# Patient Record
Sex: Female | Born: 1965 | Race: White | Hispanic: No | Marital: Married | Smoking: Never smoker
Health system: Southern US, Community
[De-identification: ages and names within clinical notes are randomized; demographics above are authoritative.]

## PROBLEM LIST (undated history)

## (undated) DIAGNOSIS — G43909 Migraine, unspecified, not intractable, without status migrainosus: Secondary | ICD-10-CM

## (undated) DIAGNOSIS — R42 Dizziness and giddiness: Secondary | ICD-10-CM

## (undated) DIAGNOSIS — E079 Disorder of thyroid, unspecified: Secondary | ICD-10-CM

---

## 1998-09-18 ENCOUNTER — Other Ambulatory Visit: Admission: RE | Admit: 1998-09-18 | Discharge: 1998-09-18 | Payer: Self-pay | Admitting: Gynecology

## 1999-06-22 ENCOUNTER — Other Ambulatory Visit: Admission: RE | Admit: 1999-06-22 | Discharge: 1999-06-22 | Payer: Self-pay | Admitting: Obstetrics and Gynecology

## 1999-12-11 ENCOUNTER — Encounter (HOSPITAL_COMMUNITY): Admission: RE | Admit: 1999-12-11 | Discharge: 2000-01-09 | Payer: Self-pay | Admitting: Obstetrics and Gynecology

## 1999-12-18 ENCOUNTER — Encounter: Payer: Self-pay | Admitting: Obstetrics and Gynecology

## 1999-12-18 ENCOUNTER — Inpatient Hospital Stay (HOSPITAL_COMMUNITY): Admission: AD | Admit: 1999-12-18 | Discharge: 1999-12-20 | Payer: Self-pay | Admitting: Obstetrics and Gynecology

## 2000-01-08 ENCOUNTER — Inpatient Hospital Stay (HOSPITAL_COMMUNITY): Admission: AD | Admit: 2000-01-08 | Discharge: 2000-01-11 | Payer: Self-pay | Admitting: Obstetrics & Gynecology

## 2000-01-08 ENCOUNTER — Encounter (INDEPENDENT_AMBULATORY_CARE_PROVIDER_SITE_OTHER): Payer: Self-pay

## 2000-03-26 ENCOUNTER — Other Ambulatory Visit: Admission: RE | Admit: 2000-03-26 | Discharge: 2000-03-26 | Payer: Self-pay | Admitting: Obstetrics and Gynecology

## 2000-11-05 ENCOUNTER — Other Ambulatory Visit: Admission: RE | Admit: 2000-11-05 | Discharge: 2000-11-05 | Payer: Self-pay | Admitting: Gynecology

## 2002-05-04 ENCOUNTER — Other Ambulatory Visit: Admission: RE | Admit: 2002-05-04 | Discharge: 2002-05-04 | Payer: Self-pay | Admitting: Gynecology

## 2004-02-07 ENCOUNTER — Other Ambulatory Visit: Admission: RE | Admit: 2004-02-07 | Discharge: 2004-02-07 | Payer: Self-pay | Admitting: Gynecology

## 2004-02-20 ENCOUNTER — Encounter: Admission: RE | Admit: 2004-02-20 | Discharge: 2004-02-20 | Payer: Self-pay | Admitting: Gynecology

## 2004-08-15 ENCOUNTER — Encounter (INDEPENDENT_AMBULATORY_CARE_PROVIDER_SITE_OTHER): Payer: Self-pay | Admitting: Specialist

## 2004-08-15 ENCOUNTER — Observation Stay (HOSPITAL_COMMUNITY): Admission: RE | Admit: 2004-08-15 | Discharge: 2004-08-15 | Payer: Self-pay | Admitting: Gynecology

## 2006-07-30 ENCOUNTER — Encounter: Admission: RE | Admit: 2006-07-30 | Discharge: 2006-07-30 | Payer: Self-pay | Admitting: Gynecology

## 2009-02-08 ENCOUNTER — Encounter: Admission: RE | Admit: 2009-02-08 | Discharge: 2009-02-08 | Payer: Self-pay | Admitting: Obstetrics and Gynecology

## 2011-02-01 NOTE — Op Note (Signed)
[facility]  Patient:    Suzanne Nash, Suzanne Nash                      MRN: [id number] Proc. Date: 01/08/00 Adm. Date:  [id number] Attending:  Minette Headland                           Operative Report  PREOPERATIVE DIAGNOSES:       Twin pregnancy at 36-2/[redacted] weeks gestation, vertex/breech presentation.  Patient had been prepared for cesarean delivery and patient and husband requested cesarean delivery because of the breech presentation of the second infant.  POSTOPERATIVE DIAGNOSES:      Twin pregnancy at 36-2/[redacted] weeks gestation, vertex/breech presentation.  Patient had been prepared for cesarean delivery and patient and husband requested cesarean delivery because of the breech presentation of the second infant.  OPERATIVE PROCEDURE:          Primary low transverse cervical cesarean section.  SECONDARY PROCEDURE:          Bilateral tubal ligation by Pomeroy technique.  SURGEON:                      Freddy Finner, M.D.  ESTIMATED INTRAOPERATIVE BLOOD LOSS:  600 to 800 cc.  INTRAOPERATIVE COMPLICATIONS:  None.  ANESTHESIA:                   Spinal.  INDICATIONS:                  Patient is a 45 year old white married female, gravida 3, para 2, who presented in very active labor at 8 to 9 cm of dilatation with twin pregnancy, vertex/breech.  She had had a careful consultation in the office and had decided on cesarean delivery, given the breech presentation of the second infant.  For that reason, we are now proceeding with cesarean.  She also has requested permanent surgical sterilization.  DESCRIPTION OF PROCEDURE:     Patient was taken to the operating room and placed under spinal anesthesia.  Please note that spontaneous rupture of membranes occurred approximately 10 minutes prior to arrival in the operating suite. Fluid was clear.  Patient was placed under spinal anesthesia and placed in dorsal recumbent position with elevation of the  right hip by 15 degrees.  Abdomen was prepped with the usual fashion.  Foley catheter was placed using sterile technique. Sterile drapes were applied.  A lower abdominal transverse incision was made and carried sharply down to fascia, which was entered sharply, and extended to the extent of the skin incision.  Rectus sheath was developed superiorly and inferiorly with blunt and sharp dissection, rectus muscle divided in the midline, peritoneum was entered sharply and extended bluntly and sharply to the extent of the skin incision.  Bladder blade was placed.  A transverse incision was made in the visceroperitoneum overlying the lower uterine segment and bladder bluntly dissected off the lower segment.  A transverse incision was made in the lower uterine segment and extended bluntly in a transverse direction.  A viable female infant was then delivered without difficulty.  Apgars were 8/9.  Arterial cord blood was sent for gases for two twins.  The second sac was then ruptured and a breech extraction as accomplished without significant difficulty of a viable female whose Apgars were /8. Routine and arterial cord blood gases were obtained for both infants. Placentae were then removed;  this was confirmed by manual exploration of the uterus. Uterine incision was grasped with ring forceps.  Uterine incision was then closed with running-locking 0 Monocryl.  Hemostasis was adequate.  Bladder flap was reapproximated with a running 0 Monocryl.  Irrigation was carried out.  Tubes and ovaries were inspected and found to be normal.  Uterus was normal.  The midportion of each tube was elevated with a Babcock and doubly ligated with 0 plain ties and a segment of tube excised and the mucosal segments of the tubes fulgurated on their cut ends.  Hemostasis again was adequate.  With appropriate pack, needle and instrument counts, the abdominal incision was closed in layers.  Running 0 Monocryl was  used to close the peritoneum and reapproximate the rectus muscles, fascia was closed with running 0 PDS and skin was closed with wide skin staples and quarter-inch Steri-Strips.  The patient tolerated the operative procedure well nd was taken to recovery room in good condition. DD:  01/08/00 TD:  01/08/00 Job: [id number]

## 2011-02-01 NOTE — Discharge Summary (Signed)
[facility]  Patient:    Suzanne Nash, Suzanne Nash                      MRN: [id number] Adm. Date:  [id number] Disc. Date: 12/20/99 Attending:  Madelyn Flavors CC:         Guy Sandifer. Arleta Creek, M.D.                           Discharge Summary  ADMISSION DIAGNOSES:          1. Intrauterine twin gestation at 33-3/7th weeks.                               2. Preterm labor.  DISCHARGE DIAGNOSES:          1. Intrauterine twin gestation at 33-3/7th weeks.                               2. Preterm labor.  REASON FOR ADMISSION:         This patient is a 45 year old married white female, G 3, P 2, with known twins at 33-3/7ths weeks, found to be contracting on the monitor.  The cervix was 2.5 cm dilated, 50% effaced.  She was admitted for magnesium sulfate tocolysis.  HOSPITAL COURSE:              She was admitted to the hospital and placed on magnesium sulfate.  This does a good job of stopping her uterine contractions.  LABORATORY DATA:              Laboratory returned consistent with a hemoglobin f 10.7, platelets 227,000, white count 10.8.  Creatinine 0.5, SGOT 16, SGPT 11, LDH 132, uric acid 3.6.  Urinalysis was negative, and Group-B beta Streptococcus is  pending at the time of discharge.  An  ultrasound was done to check for presentation.  Both babies were in the vertex presentation.  Baby A is on maternal left and baby B is on maternal right.  The patient was weaned from her magnesium on December 19, 1999.  She has been maintained on terbutaline 2.5 mg q.2h.  She is not contracting with this.  Her pulse is in the 80s and regular.  CONDITION ON DISCHARGE:       Good.  DIET:                         Regular diet as tolerated.  ACTIVITIES:                   Bedrest with bathroom privileges.  Pelvic rest. o lifting.  INSTRUCTIONS:                 She is to call the office for problems, including, but not limited to, contractions, vaginal bleeding,  leaking, shortness of breath. She is given instructions to check her pulse before taking terbutaline and to withhold it if her pulse is above 110.  She will then check one hour later, and  then take it if her pulse is below 110.  MEDICATIONS:                  1. Terbutaline 2.5 mg #100, one refill, one  p.o. q.2h.                               2. Amoxicillin 250 mg p.o. t.i.d. for seven days.  DISPOSITION:                  Home uterine activity monitoring will be arranged.  FOLLOWUP:                     In the office in the next five to six days. DD:  12/20/99 TD:  12/20/99 Job: [id number]

## 2011-02-01 NOTE — H&P (Signed)
NAME:  Suzanne Nash, Suzanne Nash               ACCOUNT NO.:  [id number]   MEDICAL RECORD NO.:  [id number]          PATIENT TYPE:  AMB   LOCATION:  SDC                           FACILITY:  [facility]   PHYSICIAN:  Gretta Cool, M.D. DATE OF BIRTH:  [birth year]   DATE OF ADMISSION:  DATE OF DISCHARGE:                                HISTORY & PHYSICAL   CHIEF COMPLAINT:  1.  Complex cystic left adnexal mass, suspicious of benign cystic teratoma.      Persistent ___________ x3 months.  2.  Pelvic pain.   HISTORY OF PRESENT ILLNESS:  A 45 year old gravida 3 para 4 with history of  twin delivery presents for definitive evaluation of a left adnexal complex  cystic mass.  Suzanne Nash initially presented in September 2005 with  symptoms of UTI and was found on pelvic exam to have a complex cystic left  adnexal mass suspicious of benign cystic teratoma.  She was subsequently  seen for followup, and the mass again confirmed.  It has bright echo  suspicious of BCT.  We discussed definitive therapy when she presented with  severe backache with onset of menses August 06, 2004.  She has had  persistence of predominantly right lower quadrant pain since that time.  She  is now admitted for definitive therapy by diagnostic laparoscopy, possible  laparotomy, possible salpingo-oophorectomy.  She understands the alternative  therapies all and the risks including that this is not a benign entity.  We  have discussed the least possible surgery with plan for ovarian cystectomy  and EndoCatch removal by laparoscopy.  She understands the possible need for  extended surgery.   PAST MEDICAL HISTORY:  Usual childhood diseases without sequelae.   MEDICAL ILLNESSES:  No previous hospitalizations, except for childbirth.   PRESENT MEDICATIONS:  None.   ALLERGIES:  None known.   FAMILY HISTORY:  Father has history of blood pressure elevation and stroke,  age 5.  Mother has hypercholesterolemia.  Sisters and brothers  living and  well.  No other known significant familial tendency.   REVIEW OF SYSTEMS:  HEENT:  Denies symptoms.  CARDIORESPIRATORY:  Denies  asthma, cough, rhonchi, or shortness of breath.  GI/GU:  Denies frequency,  urgency, dysuria, change in bowel habits, food intolerance.   PHYSICAL EXAMINATION:  GENERAL:  Well-developed, well-nourished thin white  female.  HEENT:  Pupils equal and reactive to light and accommodation.  Fundi not  examined.  Oropharynx clear.  NECK:  Supple, without masses or thyroid enlargement.  CHEST:  Clear to P&A.  HEART:  Regular rhythm, without murmur or cardiac enlargement.  BREASTS:  Without masses, nodes, or nipple discharge.  ABDOMEN:  Soft.  There is tenderness in her right lower quadrant, but no  rebound.  No CVA tenderness.  PELVIC:  External genitalia:  Normal female.  Vagina clean, rugose.  Adequate support.  Cervix is parous, clean.  Uterus:  Normal size, shape,  contour.  There is a left adnexal complex mas with fullness in the left  adnexal area and moderate tenderness.  Moderate tenderness in the right  adnexal area, but  no evidence of enlargement.  Rectovaginal exam confirms.  EXTREMITIES:  Negative.  NEUROLOGIC:  Physiologic.   IMPRESSION:  1.  Complex cystic left adnexal mass, suspicious of benign cystic teratoma.  2.  Right lower abdominal pain of undetermined etiology.   PLAN:  Diagnostic laparoscopy.  Left oophorocystectomy versus possible  oophorectomy, possible laparotomy.  Risk/benefit ratio, alternative  therapies all discussed in detail.       CWL/MEDQ  D:  08/14/2004  T:  08/14/2004  Job:  [id number]   cc:   __________, M.D.   Gretta Cool, M.D.  [address] [place]  [place] [address]  Fax: [phone]

## 2011-02-01 NOTE — Discharge Summary (Signed)
[facility]  Patient:    Suzanne Nash, Suzanne Nash                      MRN: [id number] Adm. Date:  [id number] Disc. Date: [id number] Attending:  Minette Headland Dictator:   Danie Chandler, R.N.                           Discharge Summary  ADMISSION DIAGNOSES:          1. Twin pregnancy at 36-2/[redacted] weeks gestation.                               2. Vertex/breech presentation.  DISCHARGE DIAGNOSES:          1. Twin pregnancy at 36-2/[redacted] weeks gestation.                               2. Vertex/breech presentation.  PROCEDURE:                    On January 08, 2000, primary low transverse cesarean section.  REASON FOR ADMISSION:         The patient is a 45 year old, married white female gravida 3, para 2, who presented in very active labor at 8 to 9 cm of dilatation with twin pregnancy with vertex/breech presentation.  The patient had decided on cesarean delivery given the breech presentation of the second infant.  HOSPITAL COURSE:              The patient is taken to the operating room and undergoes the above named procedure without complications.  This was productive of twin A, a viable female infant with Apgars of 8 at one minute and 9 at five minutes and twin B, a viable female infant with Apgars of 7 at one minute and 8 at five minutes.  Postoperatively, the patient did well.  On postoperative day #1, the patient had a good return of bowel function and was tolerating a regular diet. She was also ambulating well without difficulty.  Her hemoglobin on this day was 9.7, hematocrit 29.7, and white blood cell count 12.0.  On postoperative day #2, her  hemoglobin was 9.7.  She had good control of pain and she was discharged to home on postoperative day #3.  CONDITION ON DISCHARGE:       Good.  DIET:                         Regular as tolerated.  ACTIVITY:                     No heavy lifting, no driving, and no vaginal entry.  FOLLOW-UP:                     She is to follow up in the office in one to two weeks for incision check and she is to call for a temperature greater than 100 degrees, persistent nausea and vomiting, heavy vaginal bleeding, and/or redness or drainage from the incision site.  DISCHARGE MEDICATIONS:        1. Prenatal vitamins one p.o. q.d.  2. Pain medicines p.r.n. as directed by M.D. DD:  01/11/00 TD:  01/16/00 Job: [id number]

## 2011-02-01 NOTE — Op Note (Signed)
NAME:  Suzanne Nash, Suzanne Nash               ACCOUNT NO.:  [id number]   MEDICAL RECORD NO.:  [id number]          PATIENT TYPE:  OBV   LOCATION:  9316                          FACILITY:  [facility]   PHYSICIAN:  Gretta Cool, M.D. DATE OF BIRTH:  [birth year]   DATE OF PROCEDURE:  08/15/2004  DATE OF DISCHARGE:  08/15/2004                                 OPERATIVE REPORT   PREOPERATIVE DIAGNOSES:  1.  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  2.  Pelvic pain.   POSTOPERATIVE DIAGNOSES:  1.  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  2.  Pelvic pain.  No evidence of source of pain.   SURGEON:  Dr. Nicholas Lose.   PROCEDURE:  Diagnostic laparoscopy, attempted oophorocystectomy subsequent  left oophorectomy with endo-catch removal.   DESCRIPTION OF PROCEDURE:  Under excellent general anesthesia, orotracheal  anesthesia with the patient prepped and draped in Allen stirrups, with Hulka  tenaculum applied to the cervix and bladder drained, a subumbilical incision  was made and Veress needle introduced.  After adequate pneumoperitoneum with  carbon dioxide, the laparoscope and trocar was introduced and pelvic organs  visualized.  To accessory ports were placed.  The pelvic organs were  thoroughly examined and there was no evidence of endometriosis. The appendix  was normal.  The liver and gallbladder appeared normal.  There was no  evidence of bowel pathology or pelvic pathology.  In fact, the left ovary  that had been diagnosed as a benign cystic teratoma looked perfectly healthy  and normal.  An incision was made in the capsule of the ovary using the  harmonics scalpel needle electrode.  The incision was made fairly deep into  the cortex before evidence of the presence of the benign cystic teratoma was  confirmed.  The capsule was exceedingly tough and thick and could not be, or  the cyst could not be easily shelled out.  Once the cyst began to leak, the  decision was made to remove the entire left  ovary.  A small amount of oily  sebaceous material drained from the capsule of the ovary.  The harmonics  scalpel was then used, Gyrus harmonics scalpel was then used to cauterize  the vessels and transect the vessels.  The ovary was then placed in an endo-  catch and delivered through the 10 mm umbilical site.  The site was then  enlarged and the ovary delivered through the enlarged port site.  At this  point, the specimen was submitted for pathologic examination.  The enlarged  port site was then closed down to 10 mm size with a running suture of #0  Vicryl with careful attention at approximation of anatomic approximation of  the fascial edge.  The pelvis was then re-examined and no evidence of  bleeding was present.  The right ovary had firm nodules protruding from its  surface suspicious for adenofibromas.  The adenofibromas were biopsied and  biopsy submitted separately.  The remainder of the, the other adenofibromas  were treated by harmonics scalpel, desiccation destruction.  At this point,  re-examination of the pelvis revealed no  evidence of any other pathology.  The pelvis was irrigated with lactated Ringer's and the ports removed after  evacuation of the gas.  The fascia was approximated with deep suture of 0  Vicryl, subcutaneous closure of 5-0 Vicryl and the skin closure with 5-0  Vicryl and Steri-Strips.  At the end of the procedure, sponge and lap counts  were correct.  There were no complications.  The patient returned to the  recovery room in excellent condition.      CWL/MEDQ  D:  08/15/2004  T:  08/15/2004  Job:  [id number]

## 2013-06-17 ENCOUNTER — Other Ambulatory Visit (HOSPITAL_BASED_OUTPATIENT_CLINIC_OR_DEPARTMENT_OTHER): Payer: Self-pay | Admitting: Obstetrics and Gynecology

## 2013-06-17 DIAGNOSIS — Z1231 Encounter for screening mammogram for malignant neoplasm of breast: Secondary | ICD-10-CM

## 2013-06-18 ENCOUNTER — Ambulatory Visit (HOSPITAL_BASED_OUTPATIENT_CLINIC_OR_DEPARTMENT_OTHER)
Admission: RE | Admit: 2013-06-18 | Discharge: 2013-06-18 | Disposition: A | Discharge status: Home / Self Care | Payer: 59 | Source: Ambulatory Visit | Attending: Obstetrics and Gynecology | Admitting: Obstetrics and Gynecology

## 2013-06-18 DIAGNOSIS — Z1231 Encounter for screening mammogram for malignant neoplasm of breast: Secondary | ICD-10-CM

## 2013-07-01 ENCOUNTER — Other Ambulatory Visit: Payer: Self-pay | Admitting: Obstetrics and Gynecology

## 2013-07-01 DIAGNOSIS — R928 Other abnormal and inconclusive findings on diagnostic imaging of breast: Secondary | ICD-10-CM

## 2013-07-14 ENCOUNTER — Ambulatory Visit
Admission: RE | Admit: 2013-07-14 | Discharge: 2013-07-14 | Disposition: A | Discharge status: Home / Self Care | Payer: 59 | Source: Ambulatory Visit | Attending: Obstetrics and Gynecology | Admitting: Obstetrics and Gynecology

## 2013-07-14 DIAGNOSIS — R928 Other abnormal and inconclusive findings on diagnostic imaging of breast: Secondary | ICD-10-CM

## 2013-12-15 ENCOUNTER — Ambulatory Visit: Payer: 59 | Admitting: Endocrinology

## 2013-12-16 ENCOUNTER — Other Ambulatory Visit (HOSPITAL_COMMUNITY): Payer: Self-pay | Admitting: *Deleted

## 2013-12-16 ENCOUNTER — Other Ambulatory Visit (HOSPITAL_COMMUNITY): Payer: Self-pay | Admitting: Endocrinology

## 2013-12-16 DIAGNOSIS — E0590 Thyrotoxicosis, unspecified without thyrotoxic crisis or storm: Secondary | ICD-10-CM

## 2013-12-20 ENCOUNTER — Encounter (HOSPITAL_BASED_OUTPATIENT_CLINIC_OR_DEPARTMENT_OTHER): Payer: Self-pay | Admitting: Emergency Medicine

## 2013-12-20 ENCOUNTER — Emergency Department (HOSPITAL_BASED_OUTPATIENT_CLINIC_OR_DEPARTMENT_OTHER)
Admission: EM | Admit: 2013-12-20 | Discharge: 2013-12-20 | Disposition: A | Discharge status: Home / Self Care | Payer: 59 | Attending: Emergency Medicine | Admitting: Emergency Medicine

## 2013-12-20 DIAGNOSIS — Y9389 Activity, other specified: Secondary | ICD-10-CM | POA: Insufficient documentation

## 2013-12-20 DIAGNOSIS — Z8639 Personal history of other endocrine, nutritional and metabolic disease: Secondary | ICD-10-CM | POA: Insufficient documentation

## 2013-12-20 DIAGNOSIS — Z23 Encounter for immunization: Secondary | ICD-10-CM | POA: Insufficient documentation

## 2013-12-20 DIAGNOSIS — Z862 Personal history of diseases of the blood and blood-forming organs and certain disorders involving the immune mechanism: Secondary | ICD-10-CM | POA: Insufficient documentation

## 2013-12-20 DIAGNOSIS — S61112A Laceration without foreign body of left thumb with damage to nail, initial encounter: Secondary | ICD-10-CM

## 2013-12-20 DIAGNOSIS — W269XXA Contact with unspecified sharp object(s), initial encounter: Secondary | ICD-10-CM | POA: Insufficient documentation

## 2013-12-20 DIAGNOSIS — S61209A Unspecified open wound of unspecified finger without damage to nail, initial encounter: Secondary | ICD-10-CM | POA: Insufficient documentation

## 2013-12-20 DIAGNOSIS — Y929 Unspecified place or not applicable: Secondary | ICD-10-CM | POA: Insufficient documentation

## 2013-12-20 HISTORY — DX: Disorder of thyroid, unspecified: E07.9

## 2013-12-20 MED ORDER — CEPHALEXIN 500 MG PO CAPS: 500.0000 mg | ORAL_CAPSULE | Freq: Four times a day (QID) | ORAL | Status: AC

## 2013-12-20 MED ORDER — TETANUS-DIPHTH-ACELL PERTUSSIS 5-2.5-18.5 LF-MCG/0.5 IM SUSP
0.5000 mL | Freq: Once | INTRAMUSCULAR | Status: AC
  Administered 2013-12-20: 0.5 mL via INTRAMUSCULAR
  Filled 2013-12-20: qty 0.5

## 2013-12-20 MED ORDER — HYDROCODONE-ACETAMINOPHEN 5-325 MG PO TABS: 1.0000 | ORAL_TABLET | Freq: Once | ORAL | Status: DC

## 2013-12-20 MED ORDER — ACETAMINOPHEN 325 MG PO TABS
650.0000 mg | ORAL_TABLET | Freq: Once | ORAL | Status: AC
  Administered 2013-12-20: 650 mg via ORAL
  Filled 2013-12-20: qty 2

## 2013-12-20 MED ORDER — HYDROCODONE-ACETAMINOPHEN 5-325 MG PO TABS: 1.0000 | ORAL_TABLET | Freq: Four times a day (QID) | ORAL | Status: AC | PRN

## 2013-12-20 MED ORDER — LIDOCAINE HCL 2 % IJ SOLN
10.0000 mL | Freq: Once | INTRAMUSCULAR | Status: AC
  Administered 2013-12-20: 200 mg via INTRADERMAL
  Filled 2013-12-20: qty 20

## 2013-12-20 NOTE — ED Provider Notes (Signed)
CSN: [id number]     Arrival date & time 12/20/13  1947 History   First MD Initiated Contact with Patient 12/20/13 2043     Chief Complaint  Patient presents with  . Extremity Laceration     (Consider location/radiation/quality/duration/timing/severity/associated sxs/prior Treatment) HPI  48 year old female presents for evaluation of thumb laceration. Patient reports she was cutting her bread earlier today when she accidentally cut her left thumb. Incident happened 2 hrs ago.  Pt is R hand dominant.  Unsure last tetanus shot, c/o sharp throbbing pain to affected area.  No other injury.    Past Medical History  Diagnosis Date  . Thyroid disease    History reviewed. No pertinent past surgical history. History reviewed. No pertinent family history. History  Substance Use Topics  . Smoking status: Never Smoker   . Smokeless tobacco: Not on file  . Alcohol Use: Yes     Comment: occasionally   OB History   Grav Para Term Preterm Abortions TAB SAB Ect Mult Living                 Review of Systems  Constitutional: Negative for fever.  Skin: Positive for wound.  Neurological: Negative for numbness.      Allergies  Review of patient's allergies indicates no known allergies.  Home Medications  No current outpatient prescriptions on file. BP 140/87  Pulse 60  Temp(Src) 98.5 F (36.9 C) (Oral)  Resp 18  SpO2 100%  LMP 09/21/2012 Physical Exam  Nursing note and vitals reviewed. Constitutional: She appears well-developed and well-nourished. No distress.  HENT:  Head: Atraumatic.  Eyes: Conjunctivae are normal.  Neck: Neck supple.  Musculoskeletal: She exhibits tenderness (L hand: a 2cm deep diagnonal lac at tip of finger through nail without bone involvement.  ttp.  sensation intact.  ).  Neurological: She is alert.  Skin: No rash noted.  Psychiatric: She has a normal mood and affect.    ED Course  Procedures (including critical care time)  8:50 PM L thumb lac.   tdap given, will digital block and suture.    LACERATION REPAIR Performed by: Fayrene HelperRAN,Atreus Hasz Authorized byFayrene Helper: Carlyann Placide Consent: Verbal consent obtained. Risks and benefits: risks, benefits and alternatives were discussed Consent given by: patient Patient identity confirmed: provided demographic data Prepped and Draped in normal sterile fashion Wound explored  Laceration Location: L thumb; tip  Laceration Length: 2cm  No Foreign Bodies seen or palpated  Anesthesia: digital nerve block  Local anesthetic: lidocaine 2% w/o epinephrine  Anesthetic total: 7 ml  Irrigation method: syringe Amount of cleaning: standard  Skin closure: prolene 5.0  Number of sutures: 5  Technique: simple interrupted  Patient tolerance: Patient tolerated the procedure well with no immediate complications.   Labs Review Labs Reviewed - No data to display Imaging Review No results found.   EKG Interpretation None      MDM   Final diagnoses:  Laceration of left thumb without foreign body with damage to nail    BP 140/87  Pulse 60  Temp(Src) 98.5 F (36.9 C) (Oral)  Resp 18  SpO2 100%  LMP 09/21/2012     Fayrene HelperBowie Alona Danford, PA-C 12/20/13 2135

## 2013-12-20 NOTE — ED Provider Notes (Signed)
Medical screening examination/treatment/procedure(s) were performed by non-physician practitioner and as supervising physician I was immediately available for consultation/collaboration.     Geoffery Lyonsouglas Kyon Bentler, MD 12/20/13 2250

## 2013-12-20 NOTE — Discharge Instructions (Signed)
Please follow up with your doctor in 1 week for sutures removal.  Take antibiotic only if you notice signs of infection  Fingertip Injuries and Amputations Fingertip injuries are common and often get injured because they are last to escape when pulling your hand out of harm's way. You have amputated (cut off) part of your finger. How this turns out depends largely on how much was amputated. If just the tip is amputated, often the end of the finger will grow back and the finger may return to much the same as it was before the injury.  If more of the finger is missing, your caregiver has done the best with the tissue remaining to allow you to keep as much finger as is possible. Your caregiver after checking your injury has tried to leave you with a painless fingertip that has durable, feeling skin. If possible, your caregiver has tried to maintain the finger's length and appearance and preserve its fingernail.  Please read the instructions outlined below and refer to this sheet in the next few weeks. These instructions provide you with general information on caring for yourself. Your caregiver may also give you specific instructions. While your treatment has been done according to the most current medical practices available, unavoidable complications occasionally occur. If you have any problems or questions after discharge, please call your caregiver. HOME CARE INSTRUCTIONS   You may resume normal diet and activities as directed or allowed.  Keep your hand elevated above the level of your heart. This helps decrease pain and swelling.  Keep ice packs (or a bag of ice wrapped in a towel) on the injured area for 15-20 minutes, 03-04 times per day, for the first two days.  Change dressings if necessary or as directed.  Clean the wound daily or as directed.  Only take over-the-counter or prescription medicines for pain, discomfort, or fever as directed by your caregiver.  Keep appointments as  directed. SEEK IMMEDIATE MEDICAL CARE IF:  You develop redness, swelling, numbness or increasing pain in the wound.  There is pus coming from the wound.  You develop an unexplained oral temperature above 102 F (38.9 C) or as your caregiver suggests.  There is a foul (bad) smell coming from the wound or dressing.  There is a breaking open of the wound (edges not staying together) after sutures or staples have been removed. MAKE SURE YOU:   Understand these instructions.  Will watch your condition.  Will get help right away if you are not doing well or get worse. Document Released: 07/24/2005 Document Revised: 11/25/2011 Document Reviewed: 06/22/2008 [facility] Patient Information 2014 [employer], [place].

## 2013-12-20 NOTE — ED Notes (Signed)
Pt reports that she was cutting bread and cut her left thumb. Bleeding is controlled.

## 2013-12-21 ENCOUNTER — Encounter (HOSPITAL_COMMUNITY)
Admission: RE | Admit: 2013-12-21 | Discharge: 2013-12-21 | Disposition: A | Discharge status: Home / Self Care | Payer: 59 | Source: Ambulatory Visit | Attending: Endocrinology | Admitting: Endocrinology

## 2013-12-21 DIAGNOSIS — E0590 Thyrotoxicosis, unspecified without thyrotoxic crisis or storm: Secondary | ICD-10-CM | POA: Insufficient documentation

## 2013-12-22 ENCOUNTER — Encounter (HOSPITAL_COMMUNITY)
Admission: RE | Admit: 2013-12-22 | Discharge: 2013-12-22 | Disposition: A | Discharge status: Home / Self Care | Payer: 59 | Source: Ambulatory Visit | Attending: Endocrinology | Admitting: Endocrinology

## 2013-12-22 MED ORDER — SODIUM IODIDE I 131 CAPSULE
17.0000 | Freq: Once | INTRAVENOUS | Status: AC | PRN
  Administered 2013-12-22: 17 via ORAL

## 2014-06-27 ENCOUNTER — Other Ambulatory Visit (HOSPITAL_BASED_OUTPATIENT_CLINIC_OR_DEPARTMENT_OTHER): Payer: Self-pay | Admitting: Obstetrics & Gynecology

## 2014-06-27 DIAGNOSIS — Z1239 Encounter for other screening for malignant neoplasm of breast: Secondary | ICD-10-CM

## 2014-06-28 ENCOUNTER — Ambulatory Visit (HOSPITAL_BASED_OUTPATIENT_CLINIC_OR_DEPARTMENT_OTHER)
Admission: RE | Admit: 2014-06-28 | Discharge: 2014-06-28 | Disposition: A | Discharge status: Home / Self Care | Payer: 59 | Source: Ambulatory Visit | Attending: Obstetrics & Gynecology | Admitting: Obstetrics & Gynecology

## 2014-06-28 DIAGNOSIS — Z1231 Encounter for screening mammogram for malignant neoplasm of breast: Secondary | ICD-10-CM | POA: Diagnosis present

## 2014-06-28 DIAGNOSIS — Z1239 Encounter for other screening for malignant neoplasm of breast: Secondary | ICD-10-CM

## 2015-06-15 ENCOUNTER — Other Ambulatory Visit (HOSPITAL_BASED_OUTPATIENT_CLINIC_OR_DEPARTMENT_OTHER): Payer: Self-pay | Admitting: Obstetrics & Gynecology

## 2015-06-15 DIAGNOSIS — Z1231 Encounter for screening mammogram for malignant neoplasm of breast: Secondary | ICD-10-CM

## 2015-06-20 ENCOUNTER — Ambulatory Visit (HOSPITAL_BASED_OUTPATIENT_CLINIC_OR_DEPARTMENT_OTHER)
Admission: RE | Admit: 2015-06-20 | Discharge: 2015-06-20 | Disposition: A | Discharge status: Home / Self Care | Payer: 59 | Source: Ambulatory Visit | Attending: Obstetrics & Gynecology | Admitting: Obstetrics & Gynecology

## 2015-06-20 DIAGNOSIS — Z1231 Encounter for screening mammogram for malignant neoplasm of breast: Secondary | ICD-10-CM | POA: Insufficient documentation

## 2015-07-22 ENCOUNTER — Other Ambulatory Visit: Payer: Self-pay | Admitting: Obstetrics & Gynecology

## 2015-07-22 DIAGNOSIS — Z780 Asymptomatic menopausal state: Secondary | ICD-10-CM

## 2015-08-15 ENCOUNTER — Other Ambulatory Visit: Payer: 59

## 2016-06-17 ENCOUNTER — Other Ambulatory Visit (HOSPITAL_BASED_OUTPATIENT_CLINIC_OR_DEPARTMENT_OTHER): Payer: Self-pay | Admitting: Obstetrics and Gynecology

## 2016-06-17 DIAGNOSIS — Z1231 Encounter for screening mammogram for malignant neoplasm of breast: Secondary | ICD-10-CM

## 2016-06-19 ENCOUNTER — Ambulatory Visit (HOSPITAL_BASED_OUTPATIENT_CLINIC_OR_DEPARTMENT_OTHER)
Admission: RE | Admit: 2016-06-19 | Discharge: 2016-06-19 | Disposition: A | Discharge status: Home / Self Care | Payer: 59 | Source: Ambulatory Visit | Attending: Obstetrics and Gynecology | Admitting: Obstetrics and Gynecology

## 2016-06-19 DIAGNOSIS — Z1231 Encounter for screening mammogram for malignant neoplasm of breast: Secondary | ICD-10-CM | POA: Insufficient documentation

## 2017-05-16 ENCOUNTER — Other Ambulatory Visit: Payer: Self-pay | Admitting: Obstetrics and Gynecology

## 2017-05-16 DIAGNOSIS — E041 Nontoxic single thyroid nodule: Secondary | ICD-10-CM

## 2017-05-26 ENCOUNTER — Ambulatory Visit
Admission: RE | Admit: 2017-05-26 | Discharge: 2017-05-26 | Disposition: A | Discharge status: Home / Self Care | Payer: 59 | Source: Ambulatory Visit | Attending: Obstetrics and Gynecology | Admitting: Obstetrics and Gynecology

## 2017-05-26 DIAGNOSIS — E041 Nontoxic single thyroid nodule: Secondary | ICD-10-CM

## 2017-05-29 ENCOUNTER — Other Ambulatory Visit: Payer: Self-pay | Admitting: Obstetrics and Gynecology

## 2017-05-29 DIAGNOSIS — E041 Nontoxic single thyroid nodule: Secondary | ICD-10-CM

## 2017-06-04 ENCOUNTER — Ambulatory Visit
Admission: RE | Admit: 2017-06-04 | Discharge: 2017-06-04 | Disposition: A | Discharge status: Home / Self Care | Payer: 59 | Source: Ambulatory Visit | Attending: Obstetrics and Gynecology | Admitting: Obstetrics and Gynecology

## 2017-06-04 ENCOUNTER — Other Ambulatory Visit (HOSPITAL_COMMUNITY)
Admission: RE | Admit: 2017-06-04 | Discharge: 2017-06-04 | Disposition: A | Discharge status: Home / Self Care | Payer: 59 | Source: Ambulatory Visit | Attending: Radiology | Admitting: Radiology

## 2017-06-04 DIAGNOSIS — E041 Nontoxic single thyroid nodule: Secondary | ICD-10-CM | POA: Diagnosis not present

## 2017-06-11 ENCOUNTER — Other Ambulatory Visit (HOSPITAL_BASED_OUTPATIENT_CLINIC_OR_DEPARTMENT_OTHER): Payer: Self-pay | Admitting: Obstetrics and Gynecology

## 2017-06-11 DIAGNOSIS — Z1231 Encounter for screening mammogram for malignant neoplasm of breast: Secondary | ICD-10-CM

## 2017-06-17 ENCOUNTER — Ambulatory Visit (HOSPITAL_BASED_OUTPATIENT_CLINIC_OR_DEPARTMENT_OTHER)
Admission: RE | Admit: 2017-06-17 | Discharge: 2017-06-17 | Disposition: A | Discharge status: Home / Self Care | Payer: 59 | Source: Ambulatory Visit | Attending: Obstetrics and Gynecology | Admitting: Obstetrics and Gynecology

## 2017-06-17 DIAGNOSIS — Z1231 Encounter for screening mammogram for malignant neoplasm of breast: Secondary | ICD-10-CM

## 2018-06-16 ENCOUNTER — Other Ambulatory Visit (HOSPITAL_BASED_OUTPATIENT_CLINIC_OR_DEPARTMENT_OTHER): Payer: Self-pay | Admitting: Obstetrics and Gynecology

## 2018-06-16 DIAGNOSIS — Z1231 Encounter for screening mammogram for malignant neoplasm of breast: Secondary | ICD-10-CM

## 2018-06-23 ENCOUNTER — Ambulatory Visit (HOSPITAL_BASED_OUTPATIENT_CLINIC_OR_DEPARTMENT_OTHER)
Admission: RE | Admit: 2018-06-23 | Discharge: 2018-06-23 | Disposition: A | Discharge status: Home / Self Care | Payer: 59 | Source: Ambulatory Visit | Attending: Obstetrics and Gynecology | Admitting: Obstetrics and Gynecology

## 2018-06-23 DIAGNOSIS — Z1231 Encounter for screening mammogram for malignant neoplasm of breast: Secondary | ICD-10-CM | POA: Diagnosis not present

## 2018-09-29 ENCOUNTER — Encounter (HOSPITAL_BASED_OUTPATIENT_CLINIC_OR_DEPARTMENT_OTHER): Payer: Self-pay | Admitting: Emergency Medicine

## 2018-09-29 ENCOUNTER — Other Ambulatory Visit: Payer: Self-pay

## 2018-09-29 ENCOUNTER — Emergency Department (HOSPITAL_BASED_OUTPATIENT_CLINIC_OR_DEPARTMENT_OTHER): Payer: 59

## 2018-09-29 ENCOUNTER — Emergency Department (HOSPITAL_BASED_OUTPATIENT_CLINIC_OR_DEPARTMENT_OTHER)
Admission: EM | Admit: 2018-09-29 | Discharge: 2018-09-29 | Disposition: A | Discharge status: Home / Self Care | Payer: 59 | Attending: Emergency Medicine | Admitting: Emergency Medicine

## 2018-09-29 DIAGNOSIS — R42 Dizziness and giddiness: Secondary | ICD-10-CM | POA: Diagnosis present

## 2018-09-29 DIAGNOSIS — Z79899 Other long term (current) drug therapy: Secondary | ICD-10-CM | POA: Insufficient documentation

## 2018-09-29 LAB — CBC WITH DIFFERENTIAL/PLATELET
Abs Immature Granulocytes: 0.02 10*3/uL (ref 0.00–0.07)
BASOS PCT: 1 %
Basophils Absolute: 0.1 10*3/uL (ref 0.0–0.1)
EOS ABS: 0.1 10*3/uL (ref 0.0–0.5)
Eosinophils Relative: 1 %
HCT: 41.7 % (ref 36.0–46.0)
Hemoglobin: 13.2 g/dL (ref 12.0–15.0)
IMMATURE GRANULOCYTES: 0 %
Lymphocytes Relative: 14 %
Lymphs Abs: 1.5 10*3/uL (ref 0.7–4.0)
MCH: 28.3 pg (ref 26.0–34.0)
MCHC: 31.7 g/dL (ref 30.0–36.0)
MCV: 89.5 fL (ref 80.0–100.0)
MONO ABS: 0.4 10*3/uL (ref 0.1–1.0)
MONOS PCT: 4 %
NEUTROS PCT: 80 %
Neutro Abs: 8.3 10*3/uL — ABNORMAL HIGH (ref 1.7–7.7)
PLATELETS: 254 10*3/uL (ref 150–400)
RBC: 4.66 MIL/uL (ref 3.87–5.11)
RDW: 13.2 % (ref 11.5–15.5)
WBC: 10.4 10*3/uL (ref 4.0–10.5)
nRBC: 0 % (ref 0.0–0.2)

## 2018-09-29 LAB — TROPONIN I: Troponin I: <0.03 ng/mL (ref <0.03)

## 2018-09-29 LAB — BASIC METABOLIC PANEL
Anion gap: 8 (ref 5–15)
BUN: 14 mg/dL (ref 6–20)
CALCIUM: 9.2 mg/dL (ref 8.9–10.3)
CO2: 26 mmol/L (ref 22–32)
Chloride: 106 mmol/L (ref 98–111)
Creatinine, Ser: 0.73 mg/dL (ref 0.44–1.00)
GFR calc Af Amer: >60 mL/min (ref >60)
GFR calc non Af Amer: >60 mL/min (ref >60)
GLUCOSE: 120 mg/dL — AB (ref 70–99)
Potassium: 3.8 mmol/L (ref 3.5–5.1)
Sodium: 140 mmol/L (ref 135–145)

## 2018-09-29 MED ORDER — IOPAMIDOL (ISOVUE-370) INJECTION 76%
100.0000 mL | Freq: Once | INTRAVENOUS | Status: AC | PRN
  Administered 2018-09-29: 100 mL via INTRAVENOUS

## 2018-09-29 MED ORDER — ONDANSETRON HCL 4 MG/2ML IJ SOLN
4.0000 mg | Freq: Once | INTRAMUSCULAR | Status: AC
  Administered 2018-09-29: 4 mg via INTRAVENOUS
  Filled 2018-09-29: qty 2

## 2018-09-29 MED ORDER — SODIUM CHLORIDE 0.9 % IV BOLUS
1000.0000 mL | Freq: Once | INTRAVENOUS | Status: AC
  Administered 2018-09-29: 1000 mL via INTRAVENOUS

## 2018-09-29 MED ORDER — MECLIZINE HCL 25 MG PO TABS: 25.0000 mg | ORAL_TABLET | Freq: Three times a day (TID) | ORAL | 0 refills | Status: AC | PRN

## 2018-09-29 MED ORDER — LORAZEPAM 2 MG/ML IJ SOLN
1.0000 mg | Freq: Once | INTRAMUSCULAR | Status: AC
  Administered 2018-09-29: 1 mg via INTRAVENOUS
  Filled 2018-09-29: qty 1

## 2018-09-29 MED ORDER — MECLIZINE HCL 25 MG PO TABS
25.0000 mg | ORAL_TABLET | Freq: Once | ORAL | Status: AC
  Administered 2018-09-29: 25 mg via ORAL
  Filled 2018-09-29: qty 1

## 2018-09-29 MED ORDER — ONDANSETRON 4 MG PO TBDP: 4.0000 mg | ORAL_TABLET | Freq: Three times a day (TID) | ORAL | 0 refills | Status: AC | PRN

## 2018-09-29 NOTE — ED Notes (Signed)
Pt returning from CT and sts she feels a little better.

## 2018-09-29 NOTE — ED Provider Notes (Signed)
[facility] EMERGENCY DEPARTMENT Provider Note   CSN: [id number] Arrival date & time: 09/29/18  0221     History   Chief Complaint Chief Complaint  Patient presents with  . Dizziness    HPI Suzanne Nash is a 53 y.o. female.  Patient with no significant medical history presenting with acute onset of nausea, vomiting, dizziness described as vertigo and room spinning.  Symptoms onset acutely around 3 PM which is 12 hours ago.  She is had several episodes of nausea and vomiting.  Her dizziness is persistent, progressively worsening, worse with position change.  No focal weakness, numbness or tingling.  No chest pain or shortness of breath.  No difficulty speaking or difficulty swallowing.  States she had a similar episode of dizziness many years ago but did not require any medical evaluation.  Denies any history of high blood pressure or high cholesterol.  Denies any history of recent travel or fever.  No abdominal pain or chest pain.  The history is provided by the patient and the spouse. The history is limited by the condition of the patient.  Dizziness  Associated symptoms: headaches, nausea and vomiting   Associated symptoms: no chest pain and no shortness of breath     Past Medical History:  Diagnosis Date  . Thyroid disease     There are no active problems to display for this patient.   History reviewed. No pertinent surgical history.   OB History   No obstetric history on file.      Home Medications    Prior to Admission medications   Medication Sig Start Date End Date Taking? Authorizing Provider  cephALEXin (KEFLEX) 500 MG capsule Take 1 capsule (500 mg total) by mouth 4 (four) times daily. 12/20/13   Fayrene Helperran, [place], PA-C  HYDROcodone-acetaminophen (NORCO/VICODIN) 5-325 MG per tablet Take 1 tablet by mouth every 6 (six) hours as needed for severe pain. 12/20/13   Fayrene Helperran, [place], PA-C    Family History No family history on file.  Social History Social  History   Tobacco Use  . Smoking status: Never Smoker  . Smokeless tobacco: Never Used  Substance Use Topics  . Alcohol use: Yes    Comment: occasionally  . Drug use: No     Allergies   Patient has no known allergies.   Review of Systems Review of Systems  Constitutional: Positive for fatigue. Negative for activity change and appetite change.  Respiratory: Negative for cough and shortness of breath.   Cardiovascular: Negative for chest pain.  Gastrointestinal: Positive for nausea and vomiting.  Genitourinary: Negative for dysuria and hematuria.  Musculoskeletal: Negative for arthralgias and myalgias.  Skin: Negative for wound.  Neurological: Positive for dizziness, light-headedness and headaches.   all other systems are negative except as noted in the HPI and PMH.     Physical Exam Updated Vital Signs BP (!) 156/87 (BP Location: Right Arm)   Pulse 66   Temp 98.4 F (36.9 C) (Oral)   Resp 18   Ht 5\' 6"  (1.676 m)   Wt 77.1 kg   LMP 09/21/2012   SpO2 98%   BMI 27.44 kg/m   Physical Exam Vitals signs and nursing note reviewed.  Constitutional:      General: She is not in acute distress.    Appearance: Normal appearance. She is well-developed and normal weight.     Comments: Patient sitting at edge of bed, holding eyes closed Appears uncomfortable  HENT:     Head: Normocephalic  and atraumatic.     Mouth/Throat:     Pharynx: No oropharyngeal exudate.  Eyes:     Conjunctiva/sclera: Conjunctivae normal.     Pupils: Pupils are equal, round, and reactive to light.  Neck:     Musculoskeletal: Normal range of motion and neck supple.     Comments: No meningismus. Cardiovascular:     Rate and Rhythm: Normal rate and regular rhythm.     Heart sounds: Normal heart sounds. No murmur.  Pulmonary:     Effort: Pulmonary effort is normal. No respiratory distress.     Breath sounds: Normal breath sounds.  Abdominal:     Palpations: Abdomen is soft.     Tenderness: There  is no abdominal tenderness. There is no guarding or rebound.  Musculoskeletal: Normal range of motion.        General: No tenderness.  Skin:    General: Skin is warm.     Capillary Refill: Capillary refill takes less than 2 seconds.  Neurological:     Mental Status: She is alert and oriented to person, place, and time.     Cranial Nerves: No cranial nerve deficit.     Motor: No abnormal muscle tone.     Coordination: Coordination normal.     Comments: No ataxia on finger to nose bilaterally. No pronator drift. 5/5 strength throughout. CN 2-12 intact.Equal grip strength. Sensation intact.  Patient with difficulty keeping eyes open.  Cannot assess for nystagmus.  She does not appear to have any ataxia on finger-to-nose.  After patient's symptoms were controlled, she was found to have fatigable horizontal right-sided nystagmus as well as catch-up saccades on head impulse testing turning her head to the right.  Psychiatric:        Behavior: Behavior normal.      ED Treatments / Results  Labs (all labs ordered are listed, but only abnormal results are displayed) Labs Reviewed  BASIC METABOLIC PANEL - Abnormal; Notable for the following components:      Result Value   Glucose, Bld 120 (*)    All other components within normal limits  CBC WITH DIFFERENTIAL/PLATELET - Abnormal; Notable for the following components:   Neutro Abs 8.3 (*)    All other components within normal limits  TROPONIN I    EKG EKG Interpretation  Date/Time:  Tuesday September 29 2018 03:38:17 EST Ventricular Rate:  61 PR Interval:    QRS Duration: 96 QT Interval:  405 QTC Calculation: 408 R Axis:   58 Text Interpretation:  Sinus rhythm No previous ECGs available Confirmed by Glynn Octave [id number]) on 09/29/2018 3:42:47 AM   Radiology Ct Angio Head W Or Wo Contrast  Result Date: 09/29/2018 CLINICAL DATA:  53 y/o F; nausea, vomiting, and dizziness for 2 days. EXAM: CT ANGIOGRAPHY HEAD AND NECK TECHNIQUE:  Multidetector CT imaging of the head and neck was performed using the standard protocol during bolus administration of intravenous contrast. Multiplanar CT image reconstructions and MIPs were obtained to evaluate the vascular anatomy. Carotid stenosis measurements (when applicable) are obtained utilizing NASCET criteria, using the distal internal carotid diameter as the denominator. CONTRAST:  ISOVUE-370 IOPAMIDOL (ISOVUE-370) INJECTION 76% COMPARISON:  None. FINDINGS: CT HEAD FINDINGS Brain: No evidence of acute infarction, hemorrhage, hydrocephalus, extra-axial collection or mass lesion/mass effect. Incidental simple 8 mm pineal cyst. Vascular: As below. Skull: Normal. Negative for fracture or focal lesion. Sinuses: Imaged portions are clear. Orbits: No acute finding. Review of the MIP images confirms the above findings CTA NECK  FINDINGS Aortic arch: Standard branching. Imaged portion shows no evidence of aneurysm or dissection. No significant stenosis of the major arch vessel origins. Right carotid system: No evidence of dissection, stenosis (50% or greater) or occlusion. Left carotid system: No evidence of dissection, stenosis (50% or greater) or occlusion. Vertebral arteries: Codominant. No evidence of dissection, stenosis (50% or greater) or occlusion. Skeleton: Negative. Other neck: 17 x 12 x 20 mm nodule posterior to the right lobe of thyroid. Upper chest: Negative. Review of the MIP images confirms the above findings CTA HEAD FINDINGS Anterior circulation: No significant stenosis, proximal occlusion, aneurysm, or vascular malformation. Posterior circulation: No significant stenosis, proximal occlusion, aneurysm, or vascular malformation. Venous sinuses: As permitted by contrast timing, patent. Anatomic variants: Patent bilateral posterior communicating arteries. No anterior communicating artery identified, likely hypoplastic or absent. Delayed phase: No abnormal intracranial enhancement. Review of the  MIP images confirms the above findings IMPRESSION: 1. Patent carotid and vertebral arteries. No dissection, aneurysm, or hemodynamically significant stenosis utilizing NASCET criteria. 2. Patent anterior and posterior intracranial circulation. No large vessel occlusion, aneurysm, or significant stenosis. 3. 28 mm nodule posterior to the right lobe of thyroid, possibly exophytic thyroid nodule or parathyroid adenoma. Thyroid ultrasound is recommended on a nonemergent basis. 4. Negative noncontrast CT of the head. No abnormal enhancement after administration of contrast. Electronically Signed   By: Mitzi HansenLance  Furusawa-Stratton M.D.   On: 09/29/2018 04:50   Ct Angio Neck W And/or Wo Contrast  Result Date: 09/29/2018 CLINICAL DATA:  53 y/o F; nausea, vomiting, and dizziness for 2 days. EXAM: CT ANGIOGRAPHY HEAD AND NECK TECHNIQUE: Multidetector CT imaging of the head and neck was performed using the standard protocol during bolus administration of intravenous contrast. Multiplanar CT image reconstructions and MIPs were obtained to evaluate the vascular anatomy. Carotid stenosis measurements (when applicable) are obtained utilizing NASCET criteria, using the distal internal carotid diameter as the denominator. CONTRAST:  100mL ISOVUE-370 IOPAMIDOL (ISOVUE-370) INJECTION 76% COMPARISON:  None. FINDINGS: CT HEAD FINDINGS Brain: No evidence of acute infarction, hemorrhage, hydrocephalus, extra-axial collection or mass lesion/mass effect. Incidental simple 8 mm pineal cyst. Vascular: As below. Skull: Normal. Negative for fracture or focal lesion. Sinuses: Imaged portions are clear. Orbits: No acute finding. Review of the MIP images confirms the above findings CTA NECK FINDINGS Aortic arch: Standard branching. Imaged portion shows no evidence of aneurysm or dissection. No significant stenosis of the major arch vessel origins. Right carotid system: No evidence of dissection, stenosis (50% or greater) or occlusion. Left carotid  system: No evidence of dissection, stenosis (50% or greater) or occlusion. Vertebral arteries: Codominant. No evidence of dissection, stenosis (50% or greater) or occlusion. Skeleton: Negative. Other neck: 17 x 12 x 20 mm nodule posterior to the right lobe of thyroid. Upper chest: Negative. Review of the MIP images confirms the above findings CTA HEAD FINDINGS Anterior circulation: No significant stenosis, proximal occlusion, aneurysm, or vascular malformation. Posterior circulation: No significant stenosis, proximal occlusion, aneurysm, or vascular malformation. Venous sinuses: As permitted by contrast timing, patent. Anatomic variants: Patent bilateral posterior communicating arteries. No anterior communicating artery identified, likely hypoplastic or absent. Delayed phase: No abnormal intracranial enhancement. Review of the MIP images confirms the above findings IMPRESSION: 1. Patent carotid and vertebral arteries. No dissection, aneurysm, or hemodynamically significant stenosis utilizing NASCET criteria. 2. Patent anterior and posterior intracranial circulation. No large vessel occlusion, aneurysm, or significant stenosis. 3. 28 mm nodule posterior to the right lobe of thyroid, possibly exophytic thyroid nodule or parathyroid  adenoma. Thyroid ultrasound is recommended on a nonemergent basis. 4. Negative noncontrast CT of the head. No abnormal enhancement after administration of contrast. Electronically Signed   By: Mitzi Hansen M.D.   On: 09/29/2018 04:50    Procedures Procedures (including critical care time)  Medications Ordered in ED Medications  meclizine (ANTIVERT) tablet 25 mg (has no administration in time range)  LORazepam (ATIVAN) injection 1 mg (has no administration in time range)  sodium chloride 0.9 % bolus 1,000 mL (has no administration in time range)  ondansetron (ZOFRAN) injection 4 mg (4 mg Intravenous Given 09/29/18 0301)     Initial Impression / Assessment and Plan /  ED Course  I have reviewed the triage vital signs and the nursing notes.  Pertinent labs & imaging results that were available during my care of the patient were reviewed by me and considered in my medical decision making (see chart for details).    12 hours of vertigo with nausea and vomiting.  No focal deficits. No chest pain or shortness of breath.  Patient given Zofran with meclizine as well as Ativan. Last seen normal was 12 hours ago.  CT head is negative.  CTA shows no large vessel occlusion.  Patent vertebral arteries. Patient informed of thyroid nodule need for follow-up. She states she was aware of this last year and had it biopsied.   Patient reports dizziness is much improved with above measures.  She is tolerating p.o. and able to ambulate.  On recheck, patient has right-sided horizontal nystagmus that is fatigable.  She has catch-up saccades on head impulse testing to the right.  Discussed with her that peripheral vertigo is favored though central vertigo cannot be ruled out at this time.  It reassuring that she has normal posterior circulation and CTA and no ataxia on exam and she is able to ambulate and tolerate p.o. Her right sided nystagmus favors a peripheral origin of her vertigo.  No ataxia on finger-to-nose or heel-to-shin.  She is able to ambulate without assistance.  MRI offered for definitive confirmation that there is no posterior stroke.  Discussed with patient this will necessitate a transfer to [facility].  She prefers not to obtain MRI at this time and feels comfortable going home.  Patient tolerating p.o. and ambulatory.  Will treat suspected peripheral vertigo with meclizine and Zofran and PCP follow-up.  She knows to return if her symptoms worsen or she changes her mind about obtaining the MRI.   Final Clinical Impressions(s) / ED Diagnoses   Final diagnoses:  Vertigo    ED Discharge Orders    None       Yaa Donnellan, Jeannett Senior, MD 09/29/18 [phone]

## 2018-09-29 NOTE — ED Notes (Signed)
Pt tolerated gingerale without nausea.

## 2018-09-29 NOTE — ED Notes (Signed)
Pt ambulated with no assistance. Pt reports some dizziness but sts much improved.

## 2018-09-29 NOTE — ED Triage Notes (Signed)
Pt states she is been feeling dizzy for the past 2 days with nausea and vomiting.

## 2018-09-29 NOTE — ED Notes (Signed)
Pt resting with eyes closed. NAD noted. Call bell within reach.

## 2018-09-29 NOTE — ED Notes (Signed)
Pt resting in NAD

## 2018-09-29 NOTE — Discharge Instructions (Signed)
As we discussed, your work-up was reassuring.  You declined transfer for MRI today.  Take the medication as needed for dizziness.  Do not take it if you are working or driving as it could make you sleepy.  Follow-up with your doctor.  Return to the ED with new or worsening symptoms.

## 2018-12-17 IMAGING — US US THYROID
1 series · 13 of 25 positions shown · non-contrast
Comparison: None.

CLINICAL DATA: Palpable abnormality. Thyroid nodule on physical
exam.

EXAM:
THYROID ULTRASOUND
TECHNIQUE: Ultrasound examination of the thyroid gland and adjacent soft
tissues was performed.

[Series 1: us thyroid · 0.05mm/px · 13 of 48 slices shown]
[im 1/48]
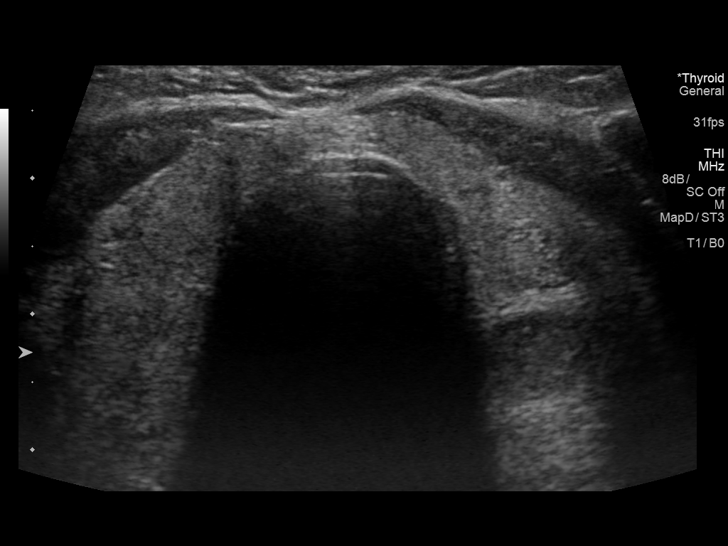
[im 4/48]
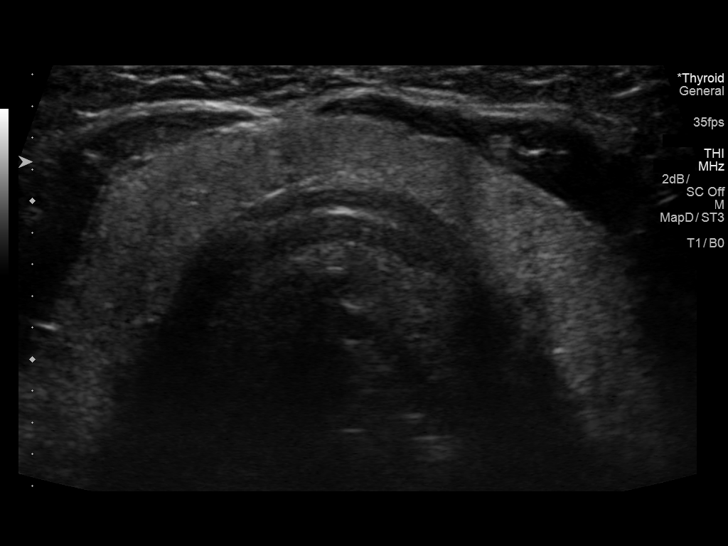
[im 8/48]
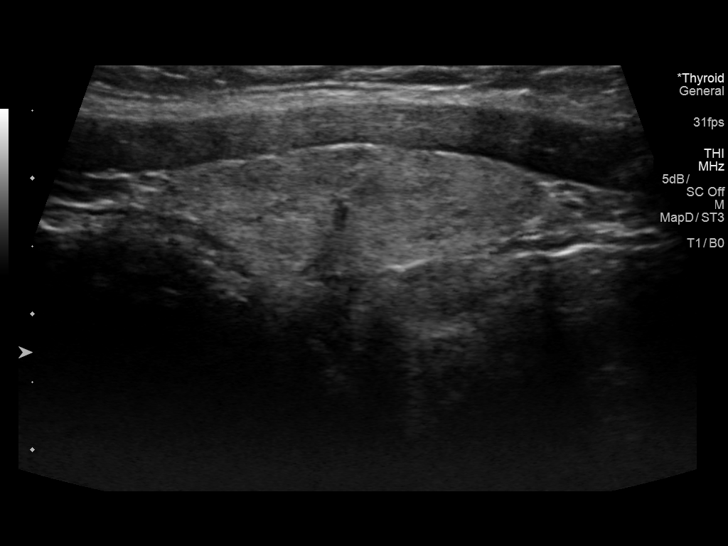
[im 12/48]
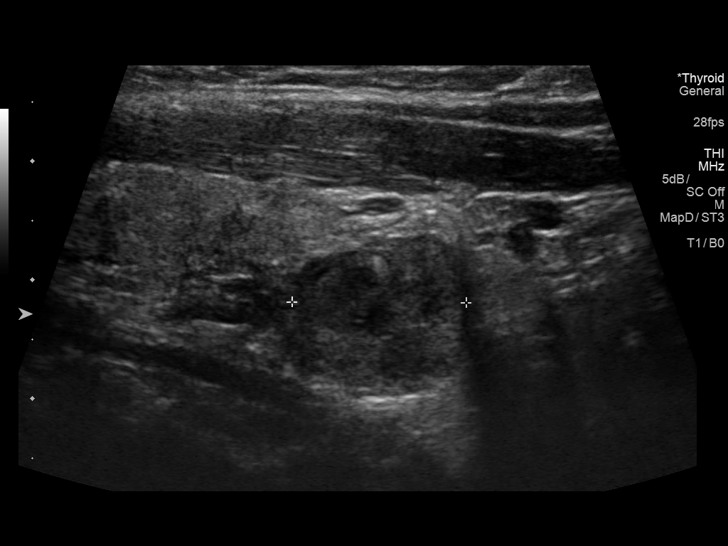
[im 16/48]
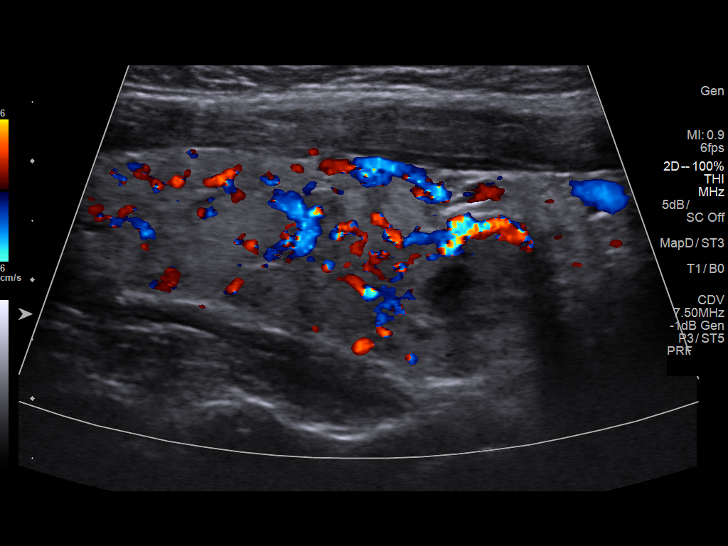
[im 20/48]
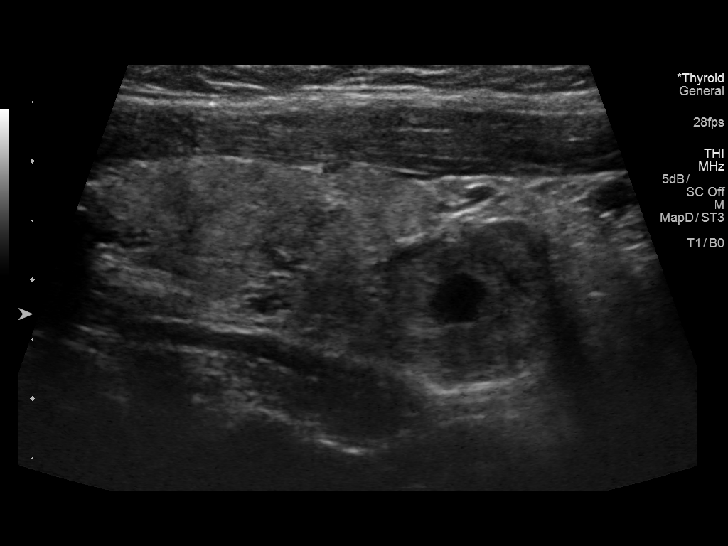
[im 24/48]
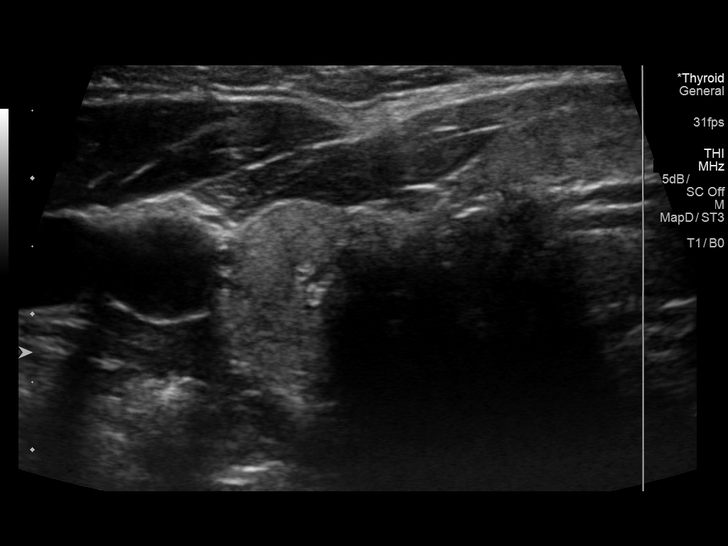
[im 28/48]
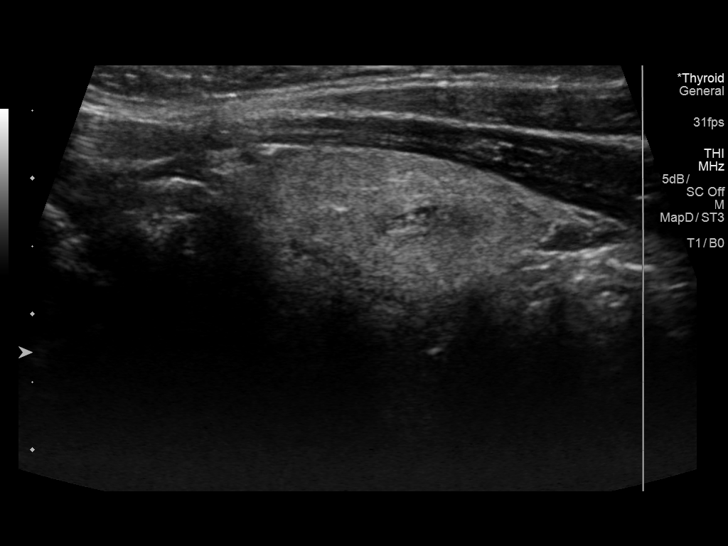
[im 32/48]
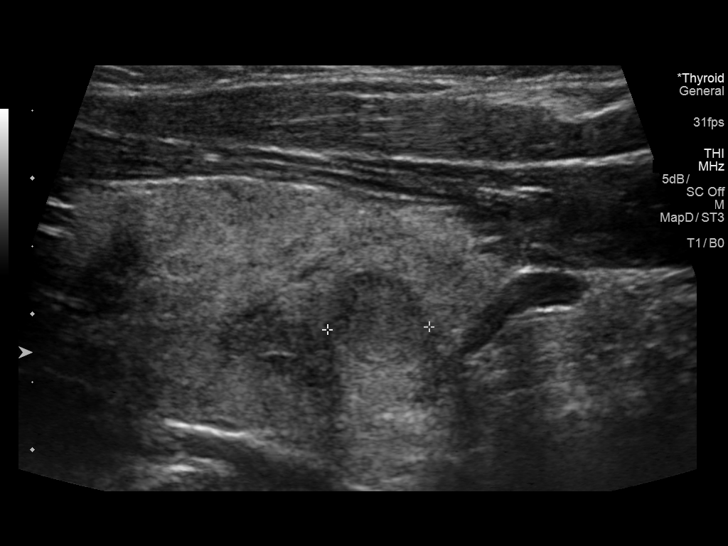
[im 36/48]
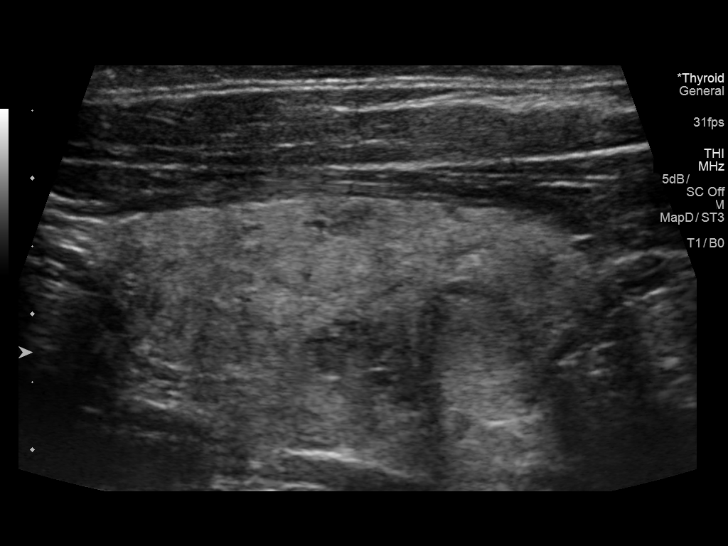
[im 40/48]
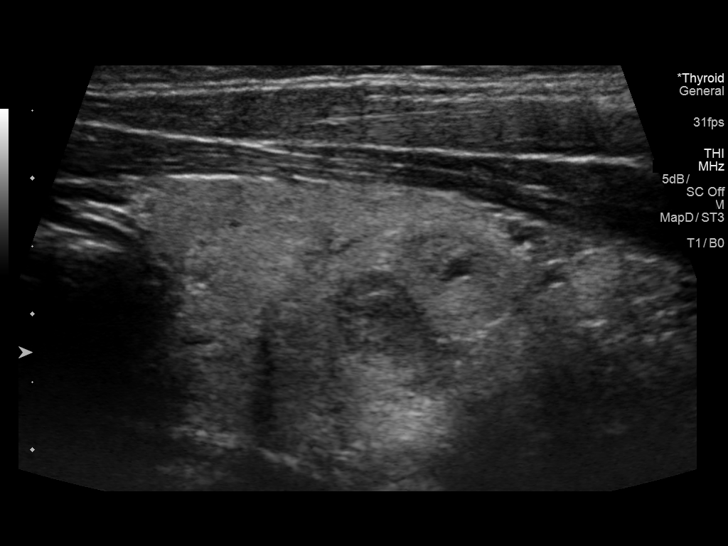
[im 44/48]
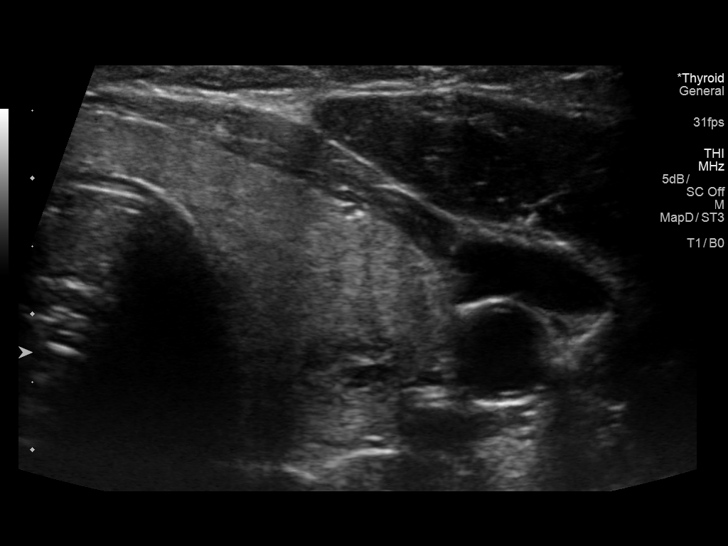
[im 48/48]
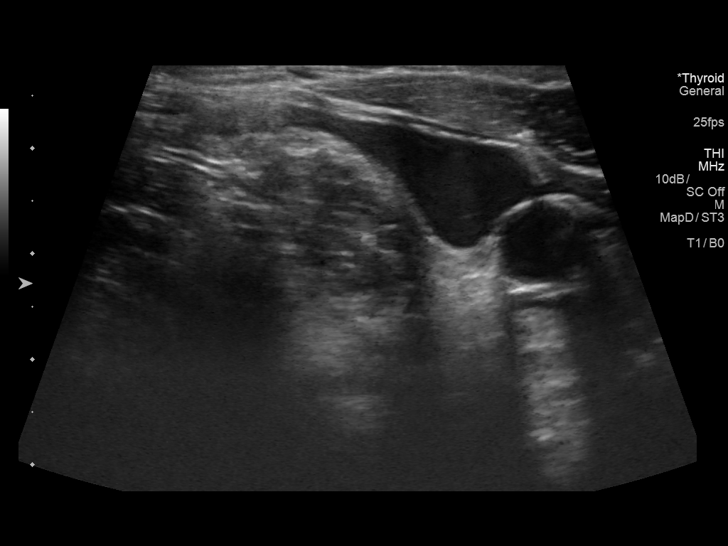

[13 of 25 positions shown; findings below may reference images not displayed]

FINDINGS: Parenchymal Echotexture: Moderately heterogenous

Isthmus: 0.4 cm

Right lobe: 4.2 x 1.7 x 2.0 cm

Left lobe: 4.1 x 2.1 x 1.7 cm

_________________________________________________________

Estimated total number of nodules >/= 1 cm: 1

Number of spongiform nodules >/=  2 cm not described below (TR1): 0

Number of mixed cystic and solid nodules >/= 1.5 cm not described
below (TR2): 0

_________________________________________________________

Nodule # 1:

Location: Right; Inferior

Maximum size: 1.5 cm; Other 2 dimensions: 1.3 x 1.4 cm

Composition: solid/almost completely solid (2)

Echogenicity: hypoechoic (2)

Shape: not taller-than-wide (0)

Margins: smooth (0)

Echogenic foci: none (0)

ACR TI-RADS total points: 4.

ACR TI-RADS risk category: TR4 (4-6 points).

ACR TI-RADS recommendations:

**Given size (>/= 1.5 cm) and appearance, fine needle aspiration of
this moderately suspicious nodule should be considered based on
TI-RADS criteria.

_________________________________________________________

Nodule # 2:

Location: Left; Mid

Maximum size: 0.9 cm; Other 2 dimensions: 0.9 x 0.8 cm

Composition: solid/almost completely solid (2)

Echogenicity: isoechoic (1)

Shape: taller-than-wide (3)

Margins: smooth (0)

Echogenic foci: none (0)

ACR TI-RADS total points: 6.

ACR TI-RADS risk category: TR4 (4-6 points).

ACR TI-RADS recommendations:

Given size (<0.9 cm) and appearance, this nodule does NOT meet
TI-RADS criteria for biopsy or dedicated follow-up.

_________________________________________________________

Multiple small nodules bilaterally measure 0.9 cm or less and do not
meet criteria for fine needle aspiration biopsy or follow-up.
IMPRESSION: There are multiple nodules as described.

Nodule 1 in the right lower pole meets criteria for fine needle
aspiration biopsy.

The above is in keeping with the ACR TI-RADS recommendations - [HOSPITAL] 5778;[DATE].

## 2021-03-08 ENCOUNTER — Other Ambulatory Visit: Payer: Self-pay

## 2021-03-08 ENCOUNTER — Encounter (HOSPITAL_BASED_OUTPATIENT_CLINIC_OR_DEPARTMENT_OTHER): Payer: Self-pay | Admitting: Emergency Medicine

## 2021-03-08 ENCOUNTER — Emergency Department (HOSPITAL_BASED_OUTPATIENT_CLINIC_OR_DEPARTMENT_OTHER)
Admission: EM | Admit: 2021-03-08 | Discharge: 2021-03-08 | Disposition: A | Discharge status: Home / Self Care | Payer: BC Managed Care – PPO | Attending: Emergency Medicine | Admitting: Emergency Medicine

## 2021-03-08 ENCOUNTER — Emergency Department (HOSPITAL_BASED_OUTPATIENT_CLINIC_OR_DEPARTMENT_OTHER): Payer: BC Managed Care – PPO

## 2021-03-08 DIAGNOSIS — R42 Dizziness and giddiness: Secondary | ICD-10-CM

## 2021-03-08 DIAGNOSIS — M549 Dorsalgia, unspecified: Secondary | ICD-10-CM | POA: Insufficient documentation

## 2021-03-08 DIAGNOSIS — S199XXA Unspecified injury of neck, initial encounter: Secondary | ICD-10-CM | POA: Insufficient documentation

## 2021-03-08 DIAGNOSIS — S0990XA Unspecified injury of head, initial encounter: Secondary | ICD-10-CM | POA: Diagnosis not present

## 2021-03-08 DIAGNOSIS — M62838 Other muscle spasm: Secondary | ICD-10-CM | POA: Diagnosis not present

## 2021-03-08 DIAGNOSIS — Y92410 Unspecified street and highway as the place of occurrence of the external cause: Secondary | ICD-10-CM | POA: Insufficient documentation

## 2021-03-08 HISTORY — DX: Migraine, unspecified, not intractable, without status migrainosus: G43.909

## 2021-03-08 HISTORY — DX: Dizziness and giddiness: R42

## 2021-03-08 MED ORDER — DIPHENHYDRAMINE HCL 50 MG/ML IJ SOLN
50.0000 mg | Freq: Once | INTRAMUSCULAR | Status: AC
  Administered 2021-03-08: 50 mg via INTRAVENOUS
  Filled 2021-03-08: qty 1

## 2021-03-08 MED ORDER — PROCHLORPERAZINE EDISYLATE 10 MG/2ML IJ SOLN
10.0000 mg | Freq: Once | INTRAMUSCULAR | Status: AC
  Administered 2021-03-08: 10 mg via INTRAVENOUS
  Filled 2021-03-08: qty 2

## 2021-03-08 MED ORDER — SODIUM CHLORIDE 0.9 % IV BOLUS
1000.0000 mL | Freq: Once | INTRAVENOUS | Status: AC
  Administered 2021-03-08: 1000 mL via INTRAVENOUS

## 2021-03-08 MED ORDER — MECLIZINE HCL 25 MG PO TABS
25.0000 mg | ORAL_TABLET | Freq: Once | ORAL | Status: AC
  Administered 2021-03-08: 25 mg via ORAL
  Filled 2021-03-08: qty 1

## 2021-03-08 MED ORDER — MECLIZINE HCL 25 MG PO TABS: 25.0000 mg | ORAL_TABLET | Freq: Three times a day (TID) | ORAL | 0 refills | Status: AC | PRN

## 2021-03-08 MED ORDER — CYCLOBENZAPRINE HCL 10 MG PO TABS: 10.0000 mg | ORAL_TABLET | Freq: Two times a day (BID) | ORAL | 0 refills | Status: AC | PRN
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
## 2024-08-08 ENCOUNTER — Emergency Department (HOSPITAL_BASED_OUTPATIENT_CLINIC_OR_DEPARTMENT_OTHER)

## 2024-08-08 ENCOUNTER — Encounter (HOSPITAL_BASED_OUTPATIENT_CLINIC_OR_DEPARTMENT_OTHER): Payer: Self-pay

## 2024-08-08 ENCOUNTER — Other Ambulatory Visit: Payer: Self-pay

## 2024-08-08 ENCOUNTER — Emergency Department (HOSPITAL_BASED_OUTPATIENT_CLINIC_OR_DEPARTMENT_OTHER)
Admission: EM | Admit: 2024-08-08 | Discharge: 2024-08-08 | Disposition: A | Discharge status: Home / Self Care | Attending: Emergency Medicine | Admitting: Emergency Medicine

## 2024-08-08 DIAGNOSIS — F419 Anxiety disorder, unspecified: Secondary | ICD-10-CM | POA: Diagnosis not present

## 2024-08-08 DIAGNOSIS — R42 Dizziness and giddiness: Secondary | ICD-10-CM

## 2024-08-08 DIAGNOSIS — R030 Elevated blood-pressure reading, without diagnosis of hypertension: Secondary | ICD-10-CM

## 2024-08-08 DIAGNOSIS — I10 Essential (primary) hypertension: Secondary | ICD-10-CM | POA: Insufficient documentation

## 2024-08-08 DIAGNOSIS — R0789 Other chest pain: Secondary | ICD-10-CM | POA: Diagnosis present

## 2024-08-08 LAB — TROPONIN T, HIGH SENSITIVITY
Troponin T High Sensitivity: <15 ng/L (ref 0–19)
Troponin T High Sensitivity: <15 ng/L (ref 0–19)

## 2024-08-08 LAB — BASIC METABOLIC PANEL WITH GFR
Anion gap: 13 (ref 5–15)
BUN: 9 mg/dL (ref 6–20)
CO2: 24 mmol/L (ref 22–32)
Calcium: 9.4 mg/dL (ref 8.9–10.3)
Chloride: 102 mmol/L (ref 98–111)
Creatinine, Ser: 0.72 mg/dL (ref 0.44–1.00)
GFR, Estimated: >60 mL/min (ref >60)
Glucose, Bld: 102 mg/dL — ABNORMAL HIGH (ref 70–99)
Potassium: 3.7 mmol/L (ref 3.5–5.1)
Sodium: 139 mmol/L (ref 135–145)

## 2024-08-08 LAB — CBC
HCT: 41.5 % (ref 36.0–46.0)
Hemoglobin: 14.1 g/dL (ref 12.0–15.0)
MCH: 29.7 pg (ref 26.0–34.0)
MCHC: 34 g/dL (ref 30.0–36.0)
MCV: 87.4 fL (ref 80.0–100.0)
Platelets: 259 K/uL (ref 150–400)
RBC: 4.75 MIL/uL (ref 3.87–5.11)
RDW: 12.8 % (ref 11.5–15.5)
WBC: 8 K/uL (ref 4.0–10.5)
nRBC: 0 % (ref 0.0–0.2)

## 2024-08-08 MED ORDER — IOHEXOL 350 MG/ML SOLN
75.0000 mL | Freq: Once | INTRAVENOUS | Status: AC | PRN
  Administered 2024-08-08: 75 mL via INTRAVENOUS

## 2024-08-08 MED ORDER — LORAZEPAM 2 MG/ML IJ SOLN
1.0000 mg | Freq: Once | INTRAMUSCULAR | Status: AC
  Administered 2024-08-08: 1 mg via INTRAVENOUS
  Filled 2024-08-08: qty 1

## 2024-08-08 MED ORDER — SODIUM CHLORIDE 0.9 % IV BOLUS
500.0000 mL | Freq: Once | INTRAVENOUS | Status: AC
  Administered 2024-08-08: 500 mL via INTRAVENOUS

## 2024-08-08 NOTE — ED Provider Notes (Signed)
 Suzanne Nash   CSN: [id number] Arrival date & time: 08/08/24  1914     Patient presents with: Chest Pain and Hypertension   Suzanne Nash is a 58 y.o. female.   58 year old female presents with concern for elevated blood pressure, chest heaviness, dizziness.  Patient reports history of vertigo, feels like symptoms are worse than her usual.  Also elevated blood pressure tonight of 230/140 at home.  Patient states that she has been under a lot of stress recently due to recent brain aneurysm and her mother, she has been at the hospital with plan to discharge home soon.  Patient went to her PCP for concern for elevated blood pressure, was started on low-dose blood pressure medication advised to monitor and record her blood pressure readings.  Patient was feeling unwell today, checked her blood pressure, it was elevated, took her blood pressure medication and came to the emergency room.  Chest heaviness had been constant until shortly after arrival in the emergency room when her chest heaviness resolved.  Pain was not exertional, not associated with shortness of breath.  Patient was told by neurology she has vestibular migraines and this is the cause of her waviness. (Vertigo)       Prior to Admission medications   Medication Sig Start Date End Date Taking? Authorizing Provider  cephALEXin  (KEFLEX ) 500 MG capsule Take 1 capsule (500 mg total) by mouth 4 (four) times daily. 12/20/13   Suzanne Colon, PA-C  cyclobenzaprine  (FLEXERIL ) 10 MG tablet Take 1 tablet (10 mg total) by mouth 2 (two) times daily as needed for muscle spasms. 03/08/21   Tegeler, Lonni PARAS, MD  HYDROcodone -acetaminophen  (NORCO/VICODIN) 5-325 MG per tablet Take 1 tablet by mouth every 6 (six) hours as needed for severe pain. 12/20/13   Suzanne Colon, PA-C  meclizine  (ANTIVERT ) 25 MG tablet Take 1 tablet (25 mg total) by mouth 3 (three) times daily as needed for dizziness.  09/29/18   Rancour, Garnette, MD  meclizine  (ANTIVERT ) 25 MG tablet Take 1 tablet (25 mg total) by mouth 3 (three) times daily as needed for dizziness. 03/08/21   Tegeler, Lonni PARAS, MD  ondansetron  (ZOFRAN  ODT) 4 MG disintegrating tablet Take 1 tablet (4 mg total) by mouth every 8 (eight) hours as needed for nausea or vomiting. 09/29/18   Rancour, Garnette, MD    Allergies: Erythromycin, Prednisone, Gabapentin, and Erythromycin base    Review of Systems Negative except as per HPI Updated Vital Signs BP 108/69   Pulse 62   Temp 98.5 F (36.9 C) (Oral)   Resp 18   Ht 5' 6 (1.676 m)   Wt 83.9 kg   LMP 09/21/2012   SpO2 93%   BMI 29.86 kg/m   Physical Exam Vitals and nursing Nash reviewed.  Constitutional:      General: She is not in acute distress.    Appearance: She is well-developed. She is not diaphoretic.  HENT:     Head: Normocephalic and atraumatic.     Nose: Nose normal.     Mouth/Throat:     Mouth: Mucous membranes are moist.  Eyes:     Extraocular Movements: Extraocular movements intact.     Conjunctiva/sclera: Conjunctivae normal.     Pupils: Pupils are equal, round, and reactive to light.  Cardiovascular:     Rate and Rhythm: Normal rate and regular rhythm.     Pulses: Normal pulses.     Heart sounds: Normal heart sounds.  Pulmonary:     Effort: Pulmonary effort is normal.     Breath sounds: Normal breath sounds.  Chest:     Chest wall: No tenderness.  Musculoskeletal:     Cervical back: Neck supple.     Right lower leg: No tenderness. No edema.     Left lower leg: No tenderness. No edema.  Skin:    General: Skin is warm and dry.     Findings: No erythema or rash.  Neurological:     Mental Status: She is alert and oriented to person, place, and time.     Cranial Nerves: No cranial nerve deficit.     Sensory: No sensory deficit.     Motor: No weakness.     Coordination: Coordination normal.     Gait: Gait normal.     Deep Tendon Reflexes: Reflexes  normal.  Psychiatric:        Mood and Affect: Mood is anxious.        Behavior: Behavior normal.     (all labs ordered are listed, but only abnormal results are displayed) Labs Reviewed  BASIC METABOLIC PANEL WITH GFR - Abnormal; Notable for the following components:      Result Value   Glucose, Bld 102 (*)    All other components within normal limits  CBC  TROPONIN T, HIGH SENSITIVITY  TROPONIN T, HIGH SENSITIVITY    EKG: EKG Interpretation Date/Time:  Sunday August 08 2024 19:21:48 EST Ventricular Rate:  71 PR Interval:  124 QRS Duration:  93 QT Interval:  379 QTC Calculation: 412 R Axis:   54  Text Interpretation: Sinus rhythm since last tracing no significant change Confirmed by Suzanne Nash [phone]) on 08/08/2024 7:52:51 PM  Radiology: CT Angio Head Neck W WO CM Result Date: 08/08/2024 EXAM: CT HEAD WITHOUT CTA HEAD AND NECK WITH AND WITHOUT 08/08/2024 09:08:03 PM TECHNIQUE: CTA of the head and neck was performed with and without the administration of 75 mL of iohexol  (OMNIPAQUE ) 350 MG/ML injection. Noncontrast CT of the head with reconstructed 2-D images are also provided for review. Multiplanar 2D and/or 3D reformatted images are provided for review. Automated exposure control, iterative reconstruction, and/or weight based adjustment of the mA/kV was utilized to reduce the radiation dose to as low as reasonably achievable. COMPARISON: 03/08/2021 CLINICAL HISTORY: Neuro deficit, acute, stroke suspected; dizziness, hx vertigo, change in symptoms today, mom with recent aneurysm. FINDINGS: CT HEAD: BRAIN AND VENTRICLES: No acute intracranial hemorrhage. No mass effect. No midline shift. No extra-axial fluid collection. No evidence of acute infarct. No hydrocephalus. ORBITS: No acute abnormality. SINUSES AND MASTOIDS: No acute abnormality. CTA NECK: AORTIC ARCH AND ARCH VESSELS: No dissection or arterial injury. No significant stenosis of the brachiocephalic or subclavian  arteries. CERVICAL CAROTID ARTERIES: No dissection, arterial injury, or hemodynamically significant stenosis by NASCET criteria. CERVICAL VERTEBRAL ARTERIES: No dissection, arterial injury, or significant stenosis. LUNGS AND MEDIASTINUM: Unremarkable. SOFT TISSUES: No acute abnormality. BONES: No acute abnormality. CTA HEAD: ANTERIOR CIRCULATION: No significant stenosis of the internal carotid arteries. No significant stenosis of the anterior cerebral arteries. No significant stenosis of the middle cerebral arteries. No aneurysm. POSTERIOR CIRCULATION: No significant stenosis of the posterior cerebral arteries. No significant stenosis of the basilar artery. No significant stenosis of the vertebral arteries. No aneurysm. OTHER: No dural venous sinus thrombosis on this non-dedicated study. IMPRESSION: 1. No acute intracranial abnormality. 2. No large vessel occlusion in the head or neck. Electronically signed by: Suzanne Stanford MD 08/08/2024 09:18 PM  EST RP Workstation: [id number]   DG Chest 2 View Result Date: 08/08/2024 EXAM: 2 VIEW(S) XRAY OF THE CHEST 08/08/2024 07:34:00 PM COMPARISON: None available. CLINICAL HISTORY: Chest pain Chest pain FINDINGS: LUNGS AND PLEURA: No focal pulmonary opacity. No pleural effusion. No pneumothorax. HEART AND MEDIASTINUM: No acute abnormality of the cardiac and mediastinal silhouettes. BONES AND SOFT TISSUES: No acute osseous abnormality. IMPRESSION: 1. No acute process. Electronically signed by: Suzanne Crease MD 08/08/2024 07:42 PM EST RP Workstation: [id number]     Procedures   Medications Ordered in the ED  LORazepam  (ATIVAN ) injection 1 mg (1 mg Intravenous Given 08/08/24 2110)  sodium chloride  0.9 % bolus 500 mL (0 mLs Intravenous Stopped 08/08/24 2134)  iohexol  (OMNIPAQUE ) 350 MG/ML injection 75 mL (75 mLs Intravenous Contrast Given 08/08/24 2059)                                    Medical Decision Making Amount and/or Complexity of Data Reviewed Labs:  ordered. Radiology: ordered.  Risk Prescription drug management.   This patient presents to the ED for concern of CP, dizziness, HTN, this involves an extensive number of treatment options, and is a complaint that carries with it a high risk of complications and morbidity.  The differential diagnosis includes but not limited to hypertensive emergency, ACS, vertigo, vascular aneurysm, metabolic/electrolyte disturbance   Co morbidities / Chronic conditions that complicate the patient evaluation  Vestibular migraines, vertigo, HTN, thyroid  disease   Additional history obtained:  Additional history obtained from EMR External records from outside source obtained and reviewed including prior labs imaging on file   Lab Tests:  I Ordered, and personally interpreted labs.  The pertinent results include: Negative.  CBC within normals.  BMP without significant findings.   Imaging Studies ordered:  I ordered imaging studies including chest x-ray, CTA head and neck I independently visualized and interpreted imaging which showed no acute process I agree with the radiologist interpretation   Cardiac Monitoring: / EKG:  The patient was maintained on a cardiac monitor.  I personally viewed and interpreted the cardiac monitored which showed an underlying rhythm of: Sinus rhythm, rate 71   Problem List / ED Course / Critical interventions / Medication management  58 year old female with complaint of chest pain with elevated blood pressure at home.  Notes frequent vertigo secondary to vestibular migraines, questioning if symptoms are worse today versus anxiety due to mother's recent aneurysm.  Neuroexam is unremarkable.  Patient is ambulatory without assistance.  Cardiac workup is reassuring.  CTA head and neck normal.  Offered reassurance.  Patient was provided with Ativan  for her dizziness.  Her blood pressures improved, symptoms improved.  Patient to follow-up with primary care provider.   Monitor blood pressure (her blood pressure cuff today does not seem to be reading accurate compared to readings in the department).  Follow-up with neurology to discuss her mother's history and see if any further imaging is necessary. I ordered medication including IVF, ativan    Reevaluation of the patient after these medicines showed that the patient improved I have reviewed the patients home medicines and have made adjustments as needed  Social Determinants of Health:  Lives with spouse   Test / Admission - Considered:  Stable for dc to follow up closely with PCP/neurology       Final diagnoses:  Atypical chest pain  Dizziness  Elevated blood pressure reading    ED  Discharge Orders     None          Suzanne Nash 08/08/24 2221    Suzanne Hollering, MD 08/08/24 2259

## 2024-08-08 NOTE — Discharge Instructions (Addendum)
 Continue to monitor your blood pressure. Follow-up with your primary care provider for recheck in the next 2 days. Return to the emergency room for any worsening or concerning symptoms. Discussed your mother's history with your neurologist, review your imaging with him to see if any further imaging is necessary.

## 2024-08-08 NOTE — ED Notes (Signed)
 Patient transported to X-ray

## 2024-08-08 NOTE — ED Triage Notes (Signed)
 Mid chest tightness that started today with high blood pressure. Reading at home was 230/140. Pt just started taking amlodipine today. Pt does not normally have high blood pressure, but has had increased stress due to mom being in hospital, PCP just prescribed BP meds
